# Patient Record
Sex: Male | Born: 1995 | Race: White | Hispanic: Yes | Marital: Single | Smoking: Never smoker
Health system: Southern US, Community
[De-identification: ages and names within clinical notes are randomized; demographics above are authoritative.]

## PROBLEM LIST (undated history)

## (undated) DIAGNOSIS — N200 Calculus of kidney: Secondary | ICD-10-CM

## (undated) DIAGNOSIS — K289 Gastrojejunal ulcer, unspecified as acute or chronic, without hemorrhage or perforation: Secondary | ICD-10-CM

## (undated) DIAGNOSIS — R569 Unspecified convulsions: Secondary | ICD-10-CM

## (undated) HISTORY — PX: TONSILLECTOMY: SUR1361

---

## 2017-10-24 ENCOUNTER — Emergency Department
Admit: 2017-10-25 | Payer: PRIVATE HEALTH INSURANCE | Primary: Student in an Organized Health Care Education/Training Program

## 2017-10-24 DIAGNOSIS — K5900 Constipation, unspecified: Secondary | ICD-10-CM

## 2017-10-24 NOTE — ED Notes (Signed)
MD released and gave discharge papers

## 2017-10-24 NOTE — ED Notes (Signed)
Education Note: Patient education given on carafate and GI Cocktail and the patient expresses understanding and acceptance of instructions. Lucas Dickson 10/24/2017 11:28 PM

## 2017-10-24 NOTE — ED Notes (Signed)
Pt to bathroom, vomited 150cc green, frothy. Pt states he feels better after emesis, improved from 8/10 pain, to 2/10, pain is is middle of abdomen and extends to lower pelvic. Had increasingly solid stool.

## 2017-10-24 NOTE — ED Triage Notes (Signed)
Triage note:  Complains of mid abdominal pain for the last hour with diarrhea; roommate and girlfriend

## 2017-10-24 NOTE — ED Provider Notes (Signed)
The history is provided by the patient.   Abdominal Pain    This is a new problem. The current episode started 1 to 2 hours ago. The problem occurs constantly. The problem has been rapidly improving (after vomiting in ED). The pain is associated with vomiting. The pain is located in the suprapubic region and epigastric region (Started central lower abd. Some more epigastric). Quality: was cramping and harp, now more "aching" The pain is moderate. Associated symptoms include diarrhea (Feels very constiapted but having small liquid stools), nausea, vomiting and constipation. Pertinent negatives include no anorexia, no fever, no belching, no hematochezia, no melena, no dysuria, no frequency, no hematuria, no headaches, no myalgias, no trauma, no chest pain, no testicular pain and no back pain. Nothing worsens the pain. The pain is relieved by vomiting. Past workup includes esophagogastroduodenoscopy, colonoscopy. His past medical history is significant for PUD. Past medical history comments: also lactose intolerance, had PB&J and chocolate milk tonight.      IMM UTD    Past Medical History:   Diagnosis Date   ??? Seizure (HCC)    ??? Stomach ulcer        Past Surgical History:   Procedure Laterality Date   ??? HX COLONOSCOPY     ??? HX ENDOSCOPY     ??? HX TONSIL AND ADENOIDECTOMY           History reviewed. No pertinent family history.    Social History     Socioeconomic History   ??? Marital status: SINGLE     Spouse name: Not on file   ??? Number of children: Not on file   ??? Years of education: Not on file   ??? Highest education level: Not on file   Social Needs   ??? Financial resource strain: Not on file   ??? Food insecurity - worry: Not on file   ??? Food insecurity - inability: Not on file   ??? Transportation needs - medical: Not on file   ??? Transportation needs - non-medical: Not on file   Occupational History   ??? Not on file   Tobacco Use   ??? Smoking status: Never Smoker   ??? Smokeless tobacco: Never Used    Substance and Sexual Activity   ??? Alcohol use: Not on file   ??? Drug use: Not on file   ??? Sexual activity: Not on file   Other Topics Concern   ??? Not on file   Social History Narrative   ??? Not on file         ALLERGIES: Bactrim [sulfamethoprim] and Topamax [topiramate]    Review of Systems   Constitutional: Negative for fever.   HENT: Negative for sore throat.    Eyes: Negative for photophobia.   Respiratory: Negative for cough, choking and shortness of breath.    Cardiovascular: Negative for chest pain.   Gastrointestinal: Positive for abdominal pain, constipation, diarrhea (Feels very constiapted but having small liquid stools), nausea and vomiting. Negative for anorexia, hematochezia and melena.   Endocrine: Negative for polydipsia and polyuria.   Genitourinary: Negative for discharge, dysuria, frequency, hematuria, penile swelling and testicular pain.   Musculoskeletal: Negative for back pain, myalgias and neck pain.   Skin: Negative for rash and wound.   Allergic/Immunologic: Positive for food allergies (greasy foods, lacose). Negative for immunocompromised state.   Neurological: Negative for headaches.   Psychiatric/Behavioral: The patient is nervous/anxious.        Vitals:    10/24/17 2201 10/24/17 2204  BP: 111/75    Pulse: 86    Resp: 18    Temp: 97.5 ??F (36.4 ??C)    SpO2: 100%    Weight:  124.9 kg (275 lb 5.7 oz)            Physical Exam   Physical Exam   Constitutional: Appears well. No distress.   HENT:   Head: NCAT  Ears: Right Ear: Tympanic membrane normal. Left Ear: Tympanic membrane normal.   Nose: Nose normal. No nasal discharge.   Mouth/Throat: Mucous membranes are moist. Pharynx is normal.   Eyes: Conjunctivae are normal. Right eye exhibits no discharge. Left eye exhibits no discharge.   Neck: Normal range of motion. Neck supple.   Cardiovascular: Normal rate, regular rhythm, S1 normal and S2 normal.  No murmur   2+ distal pulses    Pulmonary/Chest: Effort normal and breath sounds normal. No nasal flaring or stridor. No respiratory distress. no wheezes. no rhonchi. no rales. no retraction.   Abdominal: Soft. obese. LUQ and epigastirc tenderness. No rebound or guarding. No hernia. No masses or HSM. Stretch marks  Musculoskeletal: Normal range of motion. no edema, no tenderness, no deformity and no signs of injury.   Lymphadenopathy:   no cervical adenopathy.   Neurological:  alert. normal strength. normal muscle tone. No focal defecits  Skin: Skin is warm and dry. Capillary refill takes less than 3 seconds. Turgor is normal. No petechiae, no purpura and no rash noted. No cyanosis.    MDM      Patient is well hydrated, well appearing, and in no respiratory distress. Physical exam is reassuring, and without signs of serious illness. Given the patient's history, clinical course, physical exam, and imaging findings, abdominal pain is unlikely secondary to a surgical etiology. Possible Fatty liver. Is getting blood drawn soon by PCP for sz med screening. Will call to get LFTs added. Pain improved after Gi cocktail. XR with large stool, miralax as well. Should continue his lansoprazole. Patient will be discharged home with pain control and follow-up with GI.  Patient and caregivers were instructed on signs and symptoms of reasons to return including fever, worsening pain, vomiting, blood in the stool or any other concerns.        ICD-10-CM ICD-9-CM   1. Constipation, unspecified constipation type K59.00 564.00   2. Hepatomegaly R16.0 789.1       Current Discharge Medication List      START taking these medications    Details   sucralfate (CARAFATE) 1 gram tablet Take 1 Tab by mouth four (4) times daily as needed.  Qty: 20 Tab, Refills: 0      lansoprazole (PREVACID) 30 mg capsule Take 1 Cap by mouth Daily (before breakfast).  Qty: 30 Cap, Refills: 1      polyethylene glycol (MIRALAX) 17 gram/dose powder Take 17 g by mouth  daily. 1 tablespoon with 8 oz of water daily  Qty: 595 g, Refills: 0             Follow-up Information     Follow up With Specialties Details Why Contact Info    [facility] Schedule an appointment as soon as possible for a visit  [address]  [place] [address]  [phone]          I have reviewed discharge instructions with the parent.  The parent verbalized understanding.    11:55 PM  Darlina Rumpf M.D.    Procedures

## 2017-10-24 NOTE — ED Notes (Signed)
Pt took GI Cocktail, pt states he feels a little funny when swallowing, but has now resolved. Pt vital signs stable, O2 sats remained 99-100%, pulse 70. Pt now to xray with RN. MD aware of episode and per pt that the "funny feeling" has resolved

## 2017-10-24 NOTE — ED Notes (Signed)
MD released and gave discharge papers

## 2017-10-25 ENCOUNTER — Inpatient Hospital Stay
Admit: 2017-10-25 | Discharge: 2017-10-25 | Disposition: A | Discharge status: Home / Self Care | Payer: PRIVATE HEALTH INSURANCE | Attending: Pediatrics

## 2017-10-25 DIAGNOSIS — N200 Calculus of kidney: Secondary | ICD-10-CM

## 2017-10-25 MED ORDER — LIDOCAINE 2 % MUCOSAL SOLN
2 % | Freq: Once | Status: AC
Start: 2017-10-25 — End: 2017-10-24
  Administered 2017-10-25: 04:00:00 via ORAL

## 2017-10-25 MED ORDER — SUCRALFATE 1 GRAM TAB
1 gram | ORAL | Status: AC
Start: 2017-10-25 — End: 2017-10-24
  Administered 2017-10-25: 04:00:00 via ORAL

## 2017-10-25 MED ORDER — LANSOPRAZOLE 30 MG CAP, DELAYED RELEASE
30 mg | ORAL_CAPSULE | Freq: Every day | ORAL | 1 refills | Status: AC
Start: 2017-10-25 — End: ?

## 2017-10-25 MED ORDER — POLYETHYLENE GLYCOL 3350 100 % ORAL POWDER
17 gram/dose | Freq: Every day | ORAL | 0 refills | Status: AC
Start: 2017-10-25 — End: ?

## 2017-10-25 MED ORDER — SUCRALFATE 1 GRAM TAB
1 gram | ORAL_TABLET | Freq: Four times a day (QID) | ORAL | 0 refills | Status: DC | PRN
Start: 2017-10-25 — End: 2018-06-11

## 2017-10-25 MED FILL — MAG-AL PLUS 200 MG-200 MG-20 MG/5 ML ORAL SUSPENSION: 200-200-20 mg/5 mL | ORAL | Qty: 30

## 2017-10-25 MED FILL — SUCRALFATE 1 GRAM TAB: 1 gram | ORAL | Qty: 1

## 2017-10-25 NOTE — ED Triage Notes (Signed)
Triage: patient seen yesterday for constipation, today continues with abdominal pain and now back pain. Patient concerned d/t continued/worsening pain, and dx related to hepatomegaly also. Vomited x 1 just PTA. Decreased appetite today. No known fevers.

## 2017-10-25 NOTE — ED Provider Notes (Signed)
HPI 22 yo returned to the ER for vomiting and abd pain.   Had some dried cheerios and a salad, 1/2 banana. Was doing better and then started vomiting again tonight, 3 times. Once at home and twice in waiting room. Started yesterday with abd pain ,vomiting. No fever. Diarrhea yesterday. Diarrhea today. Started taking miralax tonight at 7pm- took 1 capful around 8 pm. girlfriend had vomiting yesterday- she had the stomach bug.    Past Medical History:   Diagnosis Date   ??? Seizure (HCC)    ??? Stomach ulcer        Past Surgical History:   Procedure Laterality Date   ??? HX COLONOSCOPY     ??? HX ENDOSCOPY     ??? HX TONSIL AND ADENOIDECTOMY           History reviewed. No pertinent family history.    Social History     Socioeconomic History   ??? Marital status: SINGLE     Spouse name: Not on file   ??? Number of children: Not on file   ??? Years of education: Not on file   ??? Highest education level: Not on file   Social Needs   ??? Financial resource strain: Not on file   ??? Food insecurity - worry: Not on file   ??? Food insecurity - inability: Not on file   ??? Transportation needs - medical: Not on file   ??? Transportation needs - non-medical: Not on file   Occupational History   ??? Not on file   Tobacco Use   ??? Smoking status: Never Smoker   ??? Smokeless tobacco: Never Used   Substance and Sexual Activity   ??? Alcohol use: Not on file   ??? Drug use: Not on file   ??? Sexual activity: Not on file   Other Topics Concern   ??? Not on file   Social History Narrative   ??? Not on file         ALLERGIES: Bactrim [sulfamethoprim] and Topamax [topiramate]    Review of Systems    Vitals:    10/25/17 2219   BP: 124/79   Pulse: 80   Resp: 18   Temp: 97.7 ??F (36.5 ??C)   SpO2: 98%   Weight: 125.5 kg (276 lb 10.8 oz)            Physical Exam   Constitutional: He is oriented to person, place, and time. He appears well-developed and well-nourished. He appears ill.   Uncomfortable, eyes closed and moaning. Points to right side and right  flank as source of most pain.    HENT:   Head: Normocephalic and atraumatic.   Mouth/Throat: Oropharynx is clear and moist. No oropharyngeal exudate.   Eyes: Conjunctivae are normal. No scleral icterus.   Neck: Normal range of motion. Neck supple.   Cardiovascular: Normal rate, regular rhythm and normal heart sounds.   Pulmonary/Chest: Effort normal and breath sounds normal. No respiratory distress.   Abdominal: Soft. Bowel sounds are normal. He exhibits no distension. There is tenderness in the right lower quadrant. There is no rebound and no guarding.   Musculoskeletal: Normal range of motion.   Lymphadenopathy:     He has no cervical adenopathy.   Neurological: He is alert and oriented to person, place, and time. No cranial nerve deficit.   Psychiatric: He has a normal mood and affect. His behavior is normal.   Nursing note and vitals reviewed.       MDM  Number of Diagnoses or Management Options  Nephrolithiasis: established and worsening  Diagnosis management comments: Pt with abd pain, vomiting in setting of known constipation from yesterday- increased abd pain, tenderness. Labs done and reassuring. CT ordered looking for itnraabd process and showed + kidney stone. Cr 1.4- ? Pre renal component with large muscular load.  Pt improved s/p zofran and toradol and fluids. Given urology f/u and meds for home. Felt much better- stable for outpatient eval.        Amount and/or Complexity of Data Reviewed  Clinical lab tests: ordered and reviewed  Tests in the radiology section of CPT??: ordered and reviewed  Obtain history from someone other than the patient: yes  Independent visualization of images, tracings, or specimens: yes    Risk of Complications, Morbidity, and/or Mortality  Presenting problems: moderate  Management options: moderate    Patient Progress  Patient progress: improved         Procedures

## 2017-10-26 ENCOUNTER — Emergency Department
Admit: 2017-10-26 | Payer: PRIVATE HEALTH INSURANCE | Primary: Student in an Organized Health Care Education/Training Program

## 2017-10-26 ENCOUNTER — Inpatient Hospital Stay
Admit: 2017-10-26 | Discharge: 2017-10-26 | Disposition: A | Discharge status: Home / Self Care | Payer: PRIVATE HEALTH INSURANCE | Attending: Pediatric Emergency Medicine

## 2017-10-26 ENCOUNTER — Emergency Department

## 2017-10-26 LAB — METABOLIC PANEL, COMPREHENSIVE
A-G Ratio: 1.2 (ref 1.1–2.2)
A-G Ratio: 1.1 (ref 1.1–2.2)
ALT (SGPT): 52 U/L (ref 12–78)
ALT (SGPT): 52 U/L (ref 12–78)
AST (SGOT): 37 U/L (ref 15–37)
AST (SGOT): 28 U/L (ref 15–37)
Albumin: 4.1 g/dL (ref 3.5–5.0)
Albumin: 4 g/dL (ref 3.5–5.0)
Alk. phosphatase: 109 U/L (ref 45–117)
Alk. phosphatase: 107 U/L (ref 45–117)
Anion gap: 8 mmol/L (ref 5–15)
Anion gap: 10 mmol/L (ref 5–15)
BUN/Creatinine ratio: 10 — ABNORMAL LOW (ref 12–20)
BUN/Creatinine ratio: 9 — ABNORMAL LOW (ref 12–20)
BUN: 14 MG/DL (ref 6–20)
BUN: 13 MG/DL (ref 6–20)
Bilirubin, total: 0.5 MG/DL (ref 0.2–1.0)
Bilirubin, total: 0.5 MG/DL (ref 0.2–1.0)
CO2: 20 mmol/L — ABNORMAL LOW (ref 21–32)
CO2: 19 mmol/L — ABNORMAL LOW (ref 21–32)
Calcium: 9.2 MG/DL (ref 8.5–10.1)
Calcium: 9 MG/DL (ref 8.5–10.1)
Chloride: 110 mmol/L — ABNORMAL HIGH (ref 97–108)
Chloride: 111 mmol/L — ABNORMAL HIGH (ref 97–108)
Creatinine: 1.46 MG/DL — ABNORMAL HIGH (ref 0.70–1.30)
Creatinine: 1.38 MG/DL — ABNORMAL HIGH (ref 0.70–1.30)
GFR est AA: >60 mL/min/{1.73_m2} (ref >60)
GFR est AA: >60 mL/min/{1.73_m2} (ref >60)
GFR est non-AA: >60 mL/min/{1.73_m2} (ref >60)
GFR est non-AA: >60 mL/min/{1.73_m2} (ref >60)
Globulin: 3.5 g/dL (ref 2.0–4.0)
Globulin: 3.5 g/dL (ref 2.0–4.0)
Glucose: 106 mg/dL — ABNORMAL HIGH (ref 65–100)
Glucose: 111 mg/dL — ABNORMAL HIGH (ref 65–100)
Potassium: 4.1 mmol/L (ref 3.5–5.1)
Potassium: 3.7 mmol/L (ref 3.5–5.1)
Protein, total: 7.6 g/dL (ref 6.4–8.2)
Protein, total: 7.5 g/dL (ref 6.4–8.2)
Sodium: 138 mmol/L (ref 136–145)
Sodium: 140 mmol/L (ref 136–145)

## 2017-10-26 LAB — URINALYSIS W/MICROSCOPIC
Bilirubin: NEGATIVE
Glucose: NEGATIVE mg/dL
Leukocyte Esterase: NEGATIVE
Nitrites: NEGATIVE
Protein: NEGATIVE mg/dL
Specific gravity: >1.03 — ABNORMAL HIGH (ref 1.003–1.030)
Urobilinogen: 1 EU/dL (ref 0.2–1.0)
pH (UA): 6 (ref 5.0–8.0)

## 2017-10-26 LAB — CBC WITH AUTOMATED DIFF
ABS. BASOPHILS: 0.1 10*3/uL (ref 0.0–0.1)
ABS. EOSINOPHILS: 0 10*3/uL (ref 0.0–0.4)
ABS. IMM. GRANS.: 0.1 10*3/uL — ABNORMAL HIGH (ref 0.00–0.04)
ABS. LYMPHOCYTES: 1.2 10*3/uL (ref 0.8–3.5)
ABS. MONOCYTES: 0.5 10*3/uL (ref 0.0–1.0)
ABS. NEUTROPHILS: 12.8 10*3/uL — ABNORMAL HIGH (ref 1.8–8.0)
ABSOLUTE NRBC: 0 10*3/uL (ref 0.00–0.01)
BASOPHILS: 0 % (ref 0–1)
EOSINOPHILS: 0 % (ref 0–7)
HCT: 44.6 % (ref 36.6–50.3)
HGB: 14.7 g/dL (ref 12.1–17.0)
IMMATURE GRANULOCYTES: 0 % (ref 0.0–0.5)
LYMPHOCYTES: 8 % — ABNORMAL LOW (ref 12–49)
MCH: 29.3 PG (ref 26.0–34.0)
MCHC: 33 g/dL (ref 30.0–36.5)
MCV: 88.8 FL (ref 80.0–99.0)
MONOCYTES: 4 % — ABNORMAL LOW (ref 5–13)
MPV: 9.7 FL (ref 8.9–12.9)
NEUTROPHILS: 87 % — ABNORMAL HIGH (ref 32–75)
NRBC: 0 PER 100 WBC
PLATELET: 240 10*3/uL (ref 150–400)
RBC: 5.02 M/uL (ref 4.10–5.70)
RDW: 13.2 % (ref 11.5–14.5)
WBC: 14.7 10*3/uL — ABNORMAL HIGH (ref 4.1–11.1)

## 2017-10-26 LAB — CREATININE
Creatinine: 1.45 MG/DL — ABNORMAL HIGH (ref 0.70–1.30)
GFR est AA: >60 mL/min/{1.73_m2} (ref >60)
GFR est non-AA: >60 mL/min/{1.73_m2} (ref >60)

## 2017-10-26 LAB — LIPASE: Lipase: 96 U/L (ref 73–393)

## 2017-10-26 LAB — SAMPLES BEING HELD

## 2017-10-26 LAB — C REACTIVE PROTEIN, QT: C-Reactive protein: 0.63 mg/dL — ABNORMAL HIGH (ref 0.00–0.60)

## 2017-10-26 MED ORDER — IOPAMIDOL 76 % IV SOLN
370 mg iodine /mL (76 %) | Freq: Once | INTRAVENOUS | Status: AC
Start: 2017-10-26 — End: 2017-10-26
  Administered 2017-10-26: 08:00:00 via INTRAVENOUS

## 2017-10-26 MED ORDER — SODIUM CHLORIDE 0.9 % IJ SYRG
Freq: Once | INTRAMUSCULAR | Status: AC
Start: 2017-10-26 — End: 2017-10-26
  Administered 2017-10-26: 08:00:00 via INTRAVENOUS

## 2017-10-26 MED ORDER — TAMSULOSIN SR 0.4 MG 24 HR CAP
0.4 mg | ORAL | Status: AC
Start: 2017-10-26 — End: 2017-10-26
  Administered 2017-10-26: 10:00:00 via ORAL

## 2017-10-26 MED ORDER — SODIUM CHLORIDE 0.9% BOLUS IV
0.9 % | Freq: Once | INTRAVENOUS | Status: AC
Start: 2017-10-26 — End: 2017-10-26
  Administered 2017-10-26: 05:00:00 via INTRAVENOUS

## 2017-10-26 MED ORDER — ONDANSETRON 4 MG TAB, RAPID DISSOLVE
4 mg | ORAL | Status: AC
Start: 2017-10-26 — End: 2017-10-25
  Administered 2017-10-26: 03:00:00 via ORAL

## 2017-10-26 MED ORDER — ACETAMINOPHEN 325 MG TABLET
325 mg | ORAL | Status: AC
Start: 2017-10-26 — End: 2017-10-26
  Administered 2017-10-26: 08:00:00 via ORAL

## 2017-10-26 MED ORDER — KETOROLAC TROMETHAMINE 30 MG/ML INJECTION
30 mg/mL (1 mL) | INTRAMUSCULAR | Status: AC
Start: 2017-10-26 — End: 2017-10-26
  Administered 2017-10-26: 08:00:00 via INTRAVENOUS

## 2017-10-26 MED ORDER — ONDANSETRON (PF) 4 MG/2 ML INJECTION
4 mg/2 mL | INTRAMUSCULAR | Status: AC
Start: 2017-10-26 — End: 2017-10-26
  Administered 2017-10-26: 05:00:00 via INTRAVENOUS

## 2017-10-26 MED ORDER — SODIUM CHLORIDE 0.9% BOLUS IV
0.9 % | Freq: Once | INTRAVENOUS | Status: AC
Start: 2017-10-26 — End: 2017-10-26
  Administered 2017-10-26: 08:00:00 via INTRAVENOUS

## 2017-10-26 MED ORDER — KETOROLAC TROMETHAMINE 30 MG/ML INJECTION
30 mg/mL (1 mL) | INTRAMUSCULAR | Status: DC
Start: 2017-10-26 — End: 2017-10-26

## 2017-10-26 MED ORDER — ONDANSETRON 4 MG TAB, RAPID DISSOLVE
4 mg | ORAL_TABLET | Freq: Three times a day (TID) | ORAL | 0 refills | Status: AC | PRN
Start: 2017-10-26 — End: 2018-10-21

## 2017-10-26 MED ORDER — TAMSULOSIN SR 0.4 MG 24 HR CAP
0.4 mg | ORAL_CAPSULE | Freq: Every day | ORAL | 0 refills | Status: AC
Start: 2017-10-26 — End: 2017-11-10

## 2017-10-26 MED FILL — KETOROLAC TROMETHAMINE 30 MG/ML INJECTION: 30 mg/mL (1 mL) | INTRAMUSCULAR | Qty: 1

## 2017-10-26 MED FILL — ACETAMINOPHEN 325 MG TABLET: 325 mg | ORAL | Qty: 3

## 2017-10-26 MED FILL — SODIUM CHLORIDE 0.9 % IV: INTRAVENOUS | Qty: 1000

## 2017-10-26 MED FILL — ONDANSETRON (PF) 4 MG/2 ML INJECTION: 4 mg/2 mL | INTRAMUSCULAR | Qty: 2

## 2017-10-26 MED FILL — NORMAL SALINE FLUSH 0.9 % INJECTION SYRINGE: INTRAMUSCULAR | Qty: 10

## 2017-10-26 MED FILL — TAMSULOSIN SR 0.4 MG 24 HR CAP: 0.4 mg | ORAL | Qty: 1

## 2017-10-26 MED FILL — ISOVUE-370  76 % INTRAVENOUS SOLUTION: 370 mg iodine /mL (76 %) | INTRAVENOUS | Qty: 100

## 2017-10-26 MED FILL — SODIUM CHLORIDE 0.9 % IV: INTRAVENOUS | Qty: 100

## 2017-10-26 NOTE — ED Notes (Signed)
Given a strainer to void through to possibly catch the stone. 2nd bolus completed.  Pain now 4/10

## 2017-10-26 NOTE — ED Notes (Signed)
Pt tolerated iv placement well and blood drawn. Pt having waves of increased right abdominal pain and back pain. Pt pain level around 8/10. Heat packs placed on abdomen and back.

## 2017-10-26 NOTE — ED Notes (Signed)
Pt is sleeping, no apparent distress.

## 2017-10-26 NOTE — ED Notes (Signed)
To CT via stretcher.  Had been sleeping but awoke and began writhing again in pain.  The warm packs to his back on the right and to his abdomin have helped some.  When he returns I will give him some tylenol

## 2017-10-26 NOTE — ED Notes (Signed)
released with instructions to home.

## 2017-10-26 NOTE — ED Notes (Signed)
Sipping on gator ade, less nauseated. MD spoke to patient about the plan

## 2017-10-26 NOTE — ED Notes (Signed)
Now asleep.

## 2017-10-26 NOTE — ED Notes (Signed)
Pt ambulated to bathroom, supervised by RN, but stable and able to ambulate independently.Pt continues to gag, emesis of about 75cc, green.

## 2017-10-26 NOTE — ED Notes (Signed)
Patient ambulatory to bathroom at this time.

## 2018-04-04 ENCOUNTER — Other Ambulatory Visit: Payer: Self-pay

## 2018-04-04 ENCOUNTER — Ambulatory Visit (INDEPENDENT_AMBULATORY_CARE_PROVIDER_SITE_OTHER): Payer: Managed Care, Other (non HMO)

## 2018-04-04 ENCOUNTER — Encounter (HOSPITAL_COMMUNITY): Payer: Self-pay | Admitting: Emergency Medicine

## 2018-04-04 ENCOUNTER — Ambulatory Visit (HOSPITAL_COMMUNITY)
Admission: EM | Admit: 2018-04-04 | Discharge: 2018-04-04 | Disposition: A | Discharge status: Home / Self Care | Payer: Managed Care, Other (non HMO) | Attending: Family Medicine | Admitting: Family Medicine

## 2018-04-04 DIAGNOSIS — K219 Gastro-esophageal reflux disease without esophagitis: Secondary | ICD-10-CM

## 2018-04-04 HISTORY — DX: Calculus of kidney: N20.0

## 2018-04-04 HISTORY — DX: Unspecified convulsions: R56.9

## 2018-04-04 HISTORY — DX: Gastrojejunal ulcer, unspecified as acute or chronic, without hemorrhage or perforation: K28.9

## 2018-04-04 MED ORDER — GI COCKTAIL ~~LOC~~
30.0000 mL | Freq: Once | ORAL | Status: AC
  Administered 2018-04-04: 30 mL via ORAL

## 2018-04-04 MED ORDER — OMEPRAZOLE 20 MG PO CPDR: 20.0000 mg | DELAYED_RELEASE_CAPSULE | Freq: Every day | ORAL | 1 refills | Status: AC

## 2018-04-04 MED ORDER — GI COCKTAIL ~~LOC~~
ORAL | Status: AC
  Filled 2018-04-04: qty 30

## 2018-04-04 MED ORDER — RANITIDINE HCL 150 MG PO CAPS: 150.0000 mg | ORAL_CAPSULE | Freq: Every day | ORAL | 1 refills | Status: AC

## 2018-04-04 NOTE — ED Provider Notes (Signed)
[facility]    CSN: [id number] Arrival date & time: 04/04/18  1654     History   Chief Complaint Chief Complaint  Patient presents with  . Breathing Problem    HPI Hector Ewing is a 22 y.o. male.   Patient is a 22 year old male with past medical history of GI ulcer, kidney stones, seizures.  He presents with a month and a half of chest discomfort, shortness of breath.  These problems are intermittent but seem to be worse at night.  He reports that sometimes he is awoken in the middle of the night when he is lying flat with shortness of breath and a choking sensation.  The chest pain is central and epigastric.  Sometimes he has issues swallowing.  Patient takes omeprazole most days for history of GI ulcers.  He denies any undue stress or history of anxiety attacks or anxiety.  He is currently in college and his diet consists of a lot of takeout with spicy and greasy foods.  He denies any coughing, congestion, fever, chills, body aches.  He denies any recent traveling or history of DVT or PE.  He does not smoke.  He is currently not having any chest pain  ROS per HPI      Past Medical History:  Diagnosis Date  . Gastrointestinal ulcer   . Kidney stones   . Seizures (HCC)     There are no active problems to display for this patient.   Past Surgical History:  Procedure Laterality Date  . TONSILLECTOMY         Home Medications    Prior to Admission medications   Medication Sig Start Date End Date Taking? Authorizing Provider  omeprazole (PRILOSEC) 10 MG capsule Take 10 mg by mouth daily.   Yes [provider]  zonisamide (ZONEGRAN) 100 MG capsule Take 500 mg by mouth daily.   Yes [provider]    Family History Family History  Problem Relation Age of Onset  . Thyroid disease Mother     Social History Social History   Tobacco Use  . Smoking status: Never Smoker  Substance Use Topics  . Alcohol use: Never    Frequency: Never    . Drug use: Never     Allergies   Bactrim [sulfamethoxazole-trimethoprim] and Topamax [topiramate]   Review of Systems Review of Systems   Physical Exam Triage Vital Signs ED Triage Vitals  Enc Vitals Group     BP 04/04/18 1714 115/68     Pulse Rate 04/04/18 1714 67     Resp 04/04/18 1714 18     Temp 04/04/18 1714 98.2 F (36.8 C)     Temp Source 04/04/18 1714 Oral     SpO2 04/04/18 1714 100 %     Weight --      Height --      Head Circumference --      Peak Flow --      Pain Score 04/04/18 1723 4     Pain Loc --      Pain Edu? --      Excl. in GC? --    No data found.  Updated Vital Signs BP 115/68 (BP Location: Left Arm)   Pulse 67   Temp 98.2 F (36.8 C) (Oral)   Resp 18   SpO2 100%   Visual Acuity Right Eye Distance:   Left Eye Distance:   Bilateral Distance:    Right Eye Near:   Left Eye Near:  Bilateral Near:     Physical Exam  Constitutional: He is oriented to person, place, and time. He appears well-developed and well-nourished.  HENT:  Head: Normocephalic and atraumatic.  Neck: Normal range of motion.  Cardiovascular: Normal rate and regular rhythm.  Pulmonary/Chest: Effort normal and breath sounds normal.  No adventitious breath sounds.  Abdominal: Soft. He exhibits no distension and no mass. There is tenderness. There is no rebound and no guarding. No hernia.  Tenderness to palpation of epigastric area  Musculoskeletal: Normal range of motion.  Nontender to chest wall  Neurological: He is alert and oriented to person, place, and time.  Skin: Skin is warm and dry.  Psychiatric: He has a normal mood and affect.  Nursing note and vitals reviewed.    UC Treatments / Results  Labs (all labs ordered are listed, but only abnormal results are displayed) Labs Reviewed - No data to display  EKG None  Radiology Dg Chest 2 View  Result Date: 04/04/2018 CLINICAL DATA:  Shortness of breath, chest pain for a month. EXAM: CHEST - 2 VIEW  COMPARISON:  None. FINDINGS: The heart size and mediastinal contours are within normal limits. Both lungs are clear. The visualized skeletal structures are unremarkable. IMPRESSION: No active cardiopulmonary disease. Electronically Signed   By: Bary RichardStan  Maynard M.D.   On: 04/04/2018 18:19    Procedures Procedures (including critical care time)  Medications Ordered in UC Medications  gi cocktail (Maalox,Lidocaine,Donnatal) (30 mLs Oral Given 04/04/18 1820)    Initial Impression / Assessment and Plan / UC Course  I have reviewed the triage vital signs and the nursing notes.  Pertinent labs & imaging results that were available during my care of the patient were reviewed by me and considered in my medical decision making (see chart for details).     Most likely patient's symptoms are related to GERD.  We will do EKG and chest x-ray to rule out any cardiac or pulmonary issues.  If negative will treat for GERD with omeprazole daily and Zantac at bedtime. Will give a handout on the appropriate GERD diet. If the symptoms do not improve he may need to follow up with GI.   Chest x-ray normal.  EKG showed sinus bradycardia with marked sinus arrhythmia but otherwise normal.  No concerns for any cardiac pulmonary etiologies.  Will treat for GERD as planned.  Final Clinical Impressions(s) / UC Diagnoses   Final diagnoses:  Gastroesophageal reflux disease, esophagitis presence not specified     Discharge Instructions     It was nice meeting you!!  Your EKG and chest x ray were normal.  I believe your symptoms are associated with acid reflux.  We are going to try omeprazole 20 mg once daily and zantac 150 mg at night.  Make sure that you take the omeprazole 30- 60 minutes prior to a meal with a glass of water.  Avoid spicy, greasy foods, caffeine, chocolate and milk products.  No eating 2-3 hours before bedtime. Elevate the head of the bed 30 degrees.  Try this for a few weeks to see if this  improves your symptoms.  If you don't see any improvement or your symptoms worsen please follow up with a GI doctor.     ED Prescriptions    None     Controlled Substance Prescriptions [place] Controlled Substance Registry consulted? Not Applicable   Janace ArisBast, Fortunato Nordin A, NP 04/04/18 1842

## 2018-04-04 NOTE — ED Notes (Signed)
Patient transported to X-ray 

## 2018-04-04 NOTE — ED Triage Notes (Signed)
Patient reports having trouble breathing, chest soreness for a month.  Difficulty swallowing for 1/2 month.  Patient has been seen for respiratory complaints.

## 2018-04-04 NOTE — Discharge Instructions (Addendum)
It was nice meeting you!!  Your EKG and chest x ray were normal.  I believe your symptoms are associated with acid reflux.  We are going to try omeprazole 20 mg once daily and zantac 150 mg at night.  Make sure that you take the omeprazole 30- 60 minutes prior to a meal with a glass of water.  Avoid spicy, greasy foods, caffeine, chocolate and milk products.  No eating 2-3 hours before bedtime. Elevate the head of the bed 30 degrees.  Try this for a few weeks to see if this improves your symptoms.  If you don't see any improvement or your symptoms worsen please follow up with a GI doctor.

## 2018-06-11 ENCOUNTER — Inpatient Hospital Stay
Admit: 2018-06-11 | Discharge: 2018-06-12 | Disposition: A | Discharge status: Home / Self Care | Payer: PRIVATE HEALTH INSURANCE | Attending: Pediatric Emergency Medicine

## 2018-06-11 DIAGNOSIS — N200 Calculus of kidney: Secondary | ICD-10-CM

## 2018-06-11 LAB — COMPREHENSIVE METABOLIC PANEL
ALT: 43 U/L (ref 12–78)
AST: 33 U/L (ref 15–37)
Albumin/Globulin Ratio: 1.2 (ref 1.1–2.2)
Albumin: 4.2 g/dL (ref 3.5–5.0)
Alkaline Phosphatase: 106 U/L (ref 45–117)
Anion Gap: 9 mmol/L (ref 5–15)
BUN: 16 MG/DL (ref 6–20)
Bun/Cre Ratio: 12 (ref 12–20)
CO2: 23 mmol/L (ref 21–32)
Calcium: 9.4 MG/DL (ref 8.5–10.1)
Chloride: 109 mmol/L — ABNORMAL HIGH (ref 97–108)
Creatinine: 1.35 MG/DL — ABNORMAL HIGH (ref 0.70–1.30)
EGFR IF NonAfrican American: >60 mL/min/{1.73_m2} (ref >60)
GFR African American: >60 mL/min/{1.73_m2} (ref >60)
Globulin: 3.6 g/dL (ref 2.0–4.0)
Glucose: 122 mg/dL — ABNORMAL HIGH (ref 65–100)
Potassium: 3.6 mmol/L (ref 3.5–5.1)
Sodium: 141 mmol/L (ref 136–145)
Total Bilirubin: 0.4 MG/DL (ref 0.2–1.0)
Total Protein: 7.8 g/dL (ref 6.4–8.2)

## 2018-06-11 LAB — CBC WITH AUTO DIFFERENTIAL
Basophils %: 1 % (ref 0–1)
Basophils Absolute: 0.1 10*3/uL (ref 0.0–0.1)
Eosinophils %: 1 % (ref 0–7)
Eosinophils Absolute: 0.1 10*3/uL (ref 0.0–0.4)
Granulocyte Absolute Count: 0.1 10*3/uL — ABNORMAL HIGH (ref 0.00–0.04)
Hematocrit: 46.4 % (ref 36.6–50.3)
Hemoglobin: 15.5 g/dL (ref 12.1–17.0)
Immature Granulocytes: 0 % (ref 0.0–0.5)
Lymphocytes %: 20 % (ref 12–49)
Lymphocytes Absolute: 2.5 10*3/uL (ref 0.8–3.5)
MCH: 30 PG (ref 26.0–34.0)
MCHC: 33.4 g/dL (ref 30.0–36.5)
MCV: 89.9 FL (ref 80.0–99.0)
MPV: 9.6 FL (ref 8.9–12.9)
Monocytes %: 5 % (ref 5–13)
Monocytes Absolute: 0.6 10*3/uL (ref 0.0–1.0)
NRBC Absolute: 0 10*3/uL (ref 0.00–0.01)
Neutrophils %: 73 % (ref 32–75)
Neutrophils Absolute: 8.8 10*3/uL — ABNORMAL HIGH (ref 1.8–8.0)
Nucleated RBCs: 0 PER 100 WBC
Platelets: 257 10*3/uL (ref 150–400)
RBC: 5.16 M/uL (ref 4.10–5.70)
RDW: 12.8 % (ref 11.5–14.5)
WBC: 12.1 10*3/uL — ABNORMAL HIGH (ref 4.1–11.1)

## 2018-06-11 LAB — URINALYSIS WITH MICROSCOPIC
BACTERIA, URINE: NEGATIVE /hpf
Bilirubin, Urine: NEGATIVE
Glucose, Ur: NEGATIVE mg/dL
Ketones, Urine: NEGATIVE mg/dL
Leukocyte Esterase, Urine: NEGATIVE
Nitrite, Urine: NEGATIVE
Protein, UA: 30 mg/dL — AB
RBC, UA: >100 /hpf — ABNORMAL HIGH (ref 0–5)
Specific Gravity, UA: 1.02 (ref 1.003–1.030)
Urobilinogen, UA, POCT: 0.2 EU/dL (ref 0.2–1.0)
pH, UA: 6 (ref 5.0–8.0)

## 2018-06-11 LAB — C-REACTIVE PROTEIN: CRP: 0.64 mg/dL — ABNORMAL HIGH (ref 0.00–0.60)

## 2018-06-11 LAB — LIPASE
Lipase: 113 U/L (ref 73–393)
Lipase: 113 U/L (ref 73–393)

## 2018-06-11 LAB — SAMPLES BEING HELD

## 2018-06-11 LAB — CBC WITH AUTOMATED DIFF
ABS. BASOPHILS: 0.1 10*3/uL (ref 0.0–0.1)
ABS. EOSINOPHILS: 0.1 10*3/uL (ref 0.0–0.4)
ABS. IMM. GRANS.: 0.1 10*3/uL — ABNORMAL HIGH (ref 0.00–0.04)
ABS. LYMPHOCYTES: 2.5 10*3/uL (ref 0.8–3.5)
ABS. MONOCYTES: 0.6 10*3/uL (ref 0.0–1.0)
ABS. NEUTROPHILS: 8.8 10*3/uL — ABNORMAL HIGH (ref 1.8–8.0)
ABSOLUTE NRBC: 0 10*3/uL (ref 0.00–0.01)
BASOPHILS: 1 % (ref 0–1)
EOSINOPHILS: 1 % (ref 0–7)
HCT: 46.4 % (ref 36.6–50.3)
HGB: 15.5 g/dL (ref 12.1–17.0)
IMMATURE GRANULOCYTES: 0 % (ref 0.0–0.5)
LYMPHOCYTES: 20 % (ref 12–49)
MCH: 30 PG (ref 26.0–34.0)
MCHC: 33.4 g/dL (ref 30.0–36.5)
MCV: 89.9 FL (ref 80.0–99.0)
MONOCYTES: 5 % (ref 5–13)
MPV: 9.6 FL (ref 8.9–12.9)
NEUTROPHILS: 73 % (ref 32–75)
NRBC: 0 PER 100 WBC
PLATELET: 257 10*3/uL (ref 150–400)
RBC: 5.16 M/uL (ref 4.10–5.70)
RDW: 12.8 % (ref 11.5–14.5)
WBC: 12.1 10*3/uL — ABNORMAL HIGH (ref 4.1–11.1)

## 2018-06-11 LAB — URINALYSIS W/MICROSCOPIC
Bacteria: NEGATIVE /hpf
Bilirubin: NEGATIVE
Glucose: NEGATIVE mg/dL
Ketone: NEGATIVE mg/dL
Leukocyte Esterase: NEGATIVE
Nitrites: NEGATIVE
Protein: 30 mg/dL — AB
RBC: >100 /hpf — ABNORMAL HIGH (ref 0–5)
Specific gravity: 1.02 (ref 1.003–1.030)
Urobilinogen: 0.2 EU/dL (ref 0.2–1.0)
pH (UA): 6 (ref 5.0–8.0)

## 2018-06-11 LAB — C REACTIVE PROTEIN, QT: C-Reactive protein: 0.64 mg/dL — ABNORMAL HIGH (ref 0.00–0.60)

## 2018-06-11 LAB — METABOLIC PANEL, COMPREHENSIVE
A-G Ratio: 1.2 (ref 1.1–2.2)
ALT (SGPT): 43 U/L (ref 12–78)
AST (SGOT): 33 U/L (ref 15–37)
Albumin: 4.2 g/dL (ref 3.5–5.0)
Alk. phosphatase: 106 U/L (ref 45–117)
Anion gap: 9 mmol/L (ref 5–15)
BUN/Creatinine ratio: 12 (ref 12–20)
BUN: 16 MG/DL (ref 6–20)
Bilirubin, total: 0.4 MG/DL (ref 0.2–1.0)
CO2: 23 mmol/L (ref 21–32)
Calcium: 9.4 MG/DL (ref 8.5–10.1)
Chloride: 109 mmol/L — ABNORMAL HIGH (ref 97–108)
Creatinine: 1.35 MG/DL — ABNORMAL HIGH (ref 0.70–1.30)
GFR est AA: >60 mL/min/{1.73_m2} (ref >60)
GFR est non-AA: >60 mL/min/{1.73_m2} (ref >60)
Globulin: 3.6 g/dL (ref 2.0–4.0)
Glucose: 122 mg/dL — ABNORMAL HIGH (ref 65–100)
Potassium: 3.6 mmol/L (ref 3.5–5.1)
Protein, total: 7.8 g/dL (ref 6.4–8.2)
Sodium: 141 mmol/L (ref 136–145)

## 2018-06-11 MED ORDER — SODIUM CHLORIDE 0.9% BOLUS IV
0.9 % | Freq: Once | INTRAVENOUS | Status: AC
Start: 2018-06-11 — End: 2018-06-11
  Administered 2018-06-11: 21:00:00 via INTRAVENOUS

## 2018-06-11 MED ORDER — KETOROLAC TROMETHAMINE 30 MG/ML INJECTION
30 mg/mL (1 mL) | INTRAMUSCULAR | Status: AC
Start: 2018-06-11 — End: 2018-06-11
  Administered 2018-06-11: 21:00:00 via INTRAVENOUS

## 2018-06-11 MED ORDER — ONDANSETRON 4 MG TAB, RAPID DISSOLVE
4 mg | ORAL | Status: AC
Start: 2018-06-11 — End: 2018-06-11
  Administered 2018-06-11: 20:00:00 via ORAL

## 2018-06-11 MED FILL — SODIUM CHLORIDE 0.9 % IV: INTRAVENOUS | Qty: 1000

## 2018-06-11 MED FILL — KETOROLAC TROMETHAMINE 30 MG/ML INJECTION: 30 mg/mL (1 mL) | INTRAMUSCULAR | Qty: 1

## 2018-06-11 NOTE — ED Provider Notes (Signed)
HPI 22 yo with hx of seizure d/o and kidney stone- here for avute on set of abdominal pain- right > left- was sitting in lunch room eating a sandwich when it started . No fever. No preceeding symptoms. Started suddenly made it back to his room, thought it might be a kidney stone as he has had one in the past   Vomited x 3 times at home, dry heaving as well. Has had small amounts of stool when he goes to the bathroom- " like when you are going to fart and a little comes out"  Did not take any medicines. Said he went to the bathroom and it looked like blood in his urine. No prior urinary symptoms.        Past Medical History:   Diagnosis Date   ??? Kidney stone    ??? Seizure (HCC)    ??? Seizures (HCC)    ??? Stomach ulcer        Past Surgical History:   Procedure Laterality Date   ??? HX COLONOSCOPY     ??? HX ENDOSCOPY     ??? HX TONSIL AND ADENOIDECTOMY           History reviewed. No pertinent family history.    Social History     Socioeconomic History   ??? Marital status: SINGLE     Spouse name: Not on file   ??? Number of children: Not on file   ??? Years of education: Not on file   ??? Highest education level: Not on file   Occupational History   ??? Not on file   Social Needs   ??? Financial resource strain: Not on file   ??? Food insecurity:     Worry: Not on file     Inability: Not on file   ??? Transportation needs:     Medical: Not on file     Non-medical: Not on file   Tobacco Use   ??? Smoking status: Never Smoker   ??? Smokeless tobacco: Never Used   Substance and Sexual Activity   ??? Alcohol use: Not on file   ??? Drug use: Not on file   ??? Sexual activity: Not on file   Lifestyle   ??? Physical activity:     Days per week: Not on file     Minutes per session: Not on file   ??? Stress: Not on file   Relationships   ??? Social connections:     Talks on phone: Not on file     Gets together: Not on file     Attends religious service: Not on file     Active member of club or organization: Not on file      Attends meetings of clubs or organizations: Not on file     Relationship status: Not on file   ??? Intimate partner violence:     Fear of current or ex partner: Not on file     Emotionally abused: Not on file     Physically abused: Not on file     Forced sexual activity: Not on file   Other Topics Concern   ??? Not on file   Social History Narrative   ??? Not on file         ALLERGIES: Bactrim [sulfamethoprim] and Topamax [topiramate]    Review of Systems   Gastrointestinal: Positive for abdominal pain, nausea and vomiting.   All other systems reviewed and are negative.      Vitals:  06/11/18 1603 06/11/18 1911   BP: 144/84 109/74   Pulse: 100 92   Resp: 20 18   Temp: 97.5 ??F (36.4 ??C) 98.5 ??F (36.9 ??C)   SpO2: 97% 97%   Weight: 122.2 kg (269 lb 6.4 oz)             Physical Exam   Constitutional: He is oriented to person, place, and time. He appears well-developed and well-nourished. He appears distressed.   + writhing in bed   HENT:   Mouth/Throat: No oropharyngeal exudate.   Cardiovascular: Normal rate and regular rhythm.   Pulmonary/Chest: Effort normal and breath sounds normal.   Abdominal: Soft. Normal appearance. There is tenderness (lower quadrant tenderness, no CVA tenderness.). There is no rebound and no guarding.   Genitourinary: Penis normal.   Genitourinary Comments: Testes wnl. Nontender, no swelling   Musculoskeletal: Normal range of motion.   Neurological: He is alert and oriented to person, place, and time.   Nursing note and vitals reviewed.       MDM  Number of Diagnoses or Management Options  Diagnosis management comments: 22 yo with acute on set of pain- blood in urine and feels that he passed the stone here. Urine is much clearere and easy to pass. Pain resolved. Labs reassuring, no sign of appenditcitis or other complicating process. UA showed no signs of infection. F/u with urology. As stone was passed will not give flomax.        Amount and/or Complexity of Data Reviewed   Clinical lab tests: ordered and reviewed  Obtain history from someone other than the patient: yes    Risk of Complications, Morbidity, and/or Mortality  Presenting problems: moderate  Management options: moderate    Patient Progress  Patient progress: improved         Procedures

## 2018-06-11 NOTE — ED Notes (Addendum)
Pt. States "I just went to the bathroom for a sustained amount of time, it was clear and I think if I had a stone, I passed it."  Notified Dr. Rogers     Pt. Is drinking apple juice at this time without difficulty

## 2018-06-11 NOTE — ED Notes (Signed)
Pt reports pain resolved.

## 2018-06-11 NOTE — ED Notes (Signed)
EDUCATION: Patient education given on motrin and the patient expresses understanding and acceptance of instructions. Eliza F Petrohovich, RN 06/11/2018 8:06 PM

## 2018-06-11 NOTE — ED Notes (Signed)
Patient attempted to void but states that he is unable to at this time.  Pt. Now ambulating to bathroom because he states that he is going to have a bowel movement

## 2018-06-11 NOTE — ED Notes (Signed)
Patient voided in urinal.  Urine is amber in color

## 2018-06-11 NOTE — ED Triage Notes (Signed)
Patient complaining of abdominal pain that started X1 hour ago.  Patient also noticed blood in his urine today.  Patient with 3 episodes of vomiting.

## 2018-06-11 NOTE — ED Notes (Signed)
Pt discharged home with parent/guardian. Pt acting age appropriately, respirations  Regular and unlabored, cap refill less than two seconds. Parent/guardian verbalized understanding of discharge paperwork and has no further questions at this time.

## 2018-06-11 NOTE — ED Notes (Signed)
Bedside and Verbal shift change report given to Eliza Petrohovich, RN (oncoming nurse) by Alexis Frank, RN (offgoing nurse). Report included the following information ED Summary.

## 2018-06-11 NOTE — ED Notes (Signed)
Pt. States "I just went to the bathroom for a sustained amount of time, it was clear and I think if I had a stone, I passed it."  Notified Dr. Aundria Rud     Pt. Is drinking apple juice at this time without difficulty

## 2018-06-11 NOTE — ED Provider Notes (Signed)
HPI 22 yo with hx of seizure d/o and kidney stone- here for avute on set of abdominal pain- right > left- was sitting in lunch room eating a sandwich when it started . No fever. No preceeding symptoms. Started suddenly made it back to his room, thought it might be a kidney stone as he has had one in the past   Vomited x 3 times at home, dry heaving as well. Has had small amounts of stool when he goes to the bathroom- " like when you are going to fart and a little comes out"  Did not take any medicines. Said he went to the bathroom and it looked like blood in his urine. No prior urinary symptoms.        Past Medical History:   Diagnosis Date   ??? Kidney stone    ??? Seizure (HCC)    ??? Seizures (HCC)    ??? Stomach ulcer        Past Surgical History:   Procedure Laterality Date   ??? HX COLONOSCOPY     ??? HX ENDOSCOPY     ??? HX TONSIL AND ADENOIDECTOMY           History reviewed. No pertinent family history.    Social History     Socioeconomic History   ??? Marital status: SINGLE     Spouse name: Not on file   ??? Number of children: Not on file   ??? Years of education: Not on file   ??? Highest education level: Not on file   Occupational History   ??? Not on file   Social Needs   ??? Financial resource strain: Not on file   ??? Food insecurity:     Worry: Not on file     Inability: Not on file   ??? Transportation needs:     Medical: Not on file     Non-medical: Not on file   Tobacco Use   ??? Smoking status: Never Smoker   ??? Smokeless tobacco: Never Used   Substance and Sexual Activity   ??? Alcohol use: Not on file   ??? Drug use: Not on file   ??? Sexual activity: Not on file   Lifestyle   ??? Physical activity:     Days per week: Not on file     Minutes per session: Not on file   ??? Stress: Not on file   Relationships   ??? Social connections:     Talks on phone: Not on file     Gets together: Not on file     Attends religious service: Not on file     Active member of club or organization: Not on file     Attends meetings of clubs or organizations:  Not on file     Relationship status: Not on file   ??? Intimate partner violence:     Fear of current or ex partner: Not on file     Emotionally abused: Not on file     Physically abused: Not on file     Forced sexual activity: Not on file   Other Topics Concern   ??? Not on file   Social History Narrative   ??? Not on file         ALLERGIES: Bactrim [sulfamethoprim] and Topamax [topiramate]    Review of Systems   Gastrointestinal: Positive for abdominal pain, nausea and vomiting.   All other systems reviewed and are negative.      Vitals:  06/11/18 1603 06/11/18 1911   BP: 144/84 109/74   Pulse: 100 92   Resp: 20 18   Temp: 97.5 ??F (36.4 ??C) 98.5 ??F (36.9 ??C)   SpO2: 97% 97%   Weight: 122.2 kg (269 lb 6.4 oz)             Physical Exam   Constitutional: He is oriented to person, place, and time. He appears well-developed and well-nourished. He appears distressed.   + writhing in bed   HENT:   Mouth/Throat: No oropharyngeal exudate.   Cardiovascular: Normal rate and regular rhythm.   Pulmonary/Chest: Effort normal and breath sounds normal.   Abdominal: Soft. Normal appearance. There is tenderness (lower quadrant tenderness, no CVA tenderness.). There is no rebound and no guarding.   Genitourinary: Penis normal.   Genitourinary Comments: Testes wnl. Nontender, no swelling   Musculoskeletal: Normal range of motion.   Neurological: He is alert and oriented to person, place, and time.   Nursing note and vitals reviewed.       MDM  Number of Diagnoses or Management Options  Diagnosis management comments: 22 yo with acute on set of pain- blood in urine and feels that he passed the stone here. Urine is much clearere and easy to pass. Pain resolved. Labs reassuring, no sign of appenditcitis or other complicating process. UA showed no signs of infection. F/u with urology. As stone was passed will not give flomax.        Amount and/or Complexity of Data Reviewed  Clinical lab tests: ordered and reviewed  Obtain history from  someone other than the patient: yes    Risk of Complications, Morbidity, and/or Mortality  Presenting problems: moderate  Management options: moderate    Patient Progress  Patient progress: improved         Procedures

## 2018-06-11 NOTE — ED Notes (Signed)
EDUCATION: Patient education given on motrin and the patient expresses understanding and acceptance of instructions. Ambrose Mantle, RN 06/11/2018 8:06 PM

## 2018-06-11 NOTE — ED Notes (Signed)
Patient complaining of abdominal pain that started X1 hour ago.  Patient also noticed blood in his urine today.  Patient with 3 episodes of vomiting.

## 2018-06-11 NOTE — ED Notes (Signed)
Patient voided in urinal.  Urine is amber in color

## 2018-06-11 NOTE — ED Notes (Signed)
Pt reports pain resolved .

## 2018-06-11 NOTE — ED Notes (Signed)
 Pt discharged home with parent/guardian. Pt acting age appropriately, respirations  Regular and unlabored, cap refill less than two seconds. Parent/guardian verbalized understanding of discharge paperwork and has no further questions at this time.

## 2018-06-11 NOTE — ED Notes (Signed)
Bedside and Verbal shift change report given to Corwin Levins, RN (oncoming nurse) by Vance Gather, RN (offgoing nurse). Report included the following information ED Summary.

## 2018-06-11 NOTE — ED Notes (Signed)
Patient attempted to void but states that he is unable to at this time.  Pt. Now ambulating to bathroom because he states that he is going to have a bowel movement

## 2019-10-22 IMAGING — DX DG CHEST 2V
2 series · 2 of 2 positions shown · non-contrast
Comparison: None.

CLINICAL DATA: Shortness of breath, chest pain for a month.

EXAM:
CHEST - 2 VIEW

[chest pa]
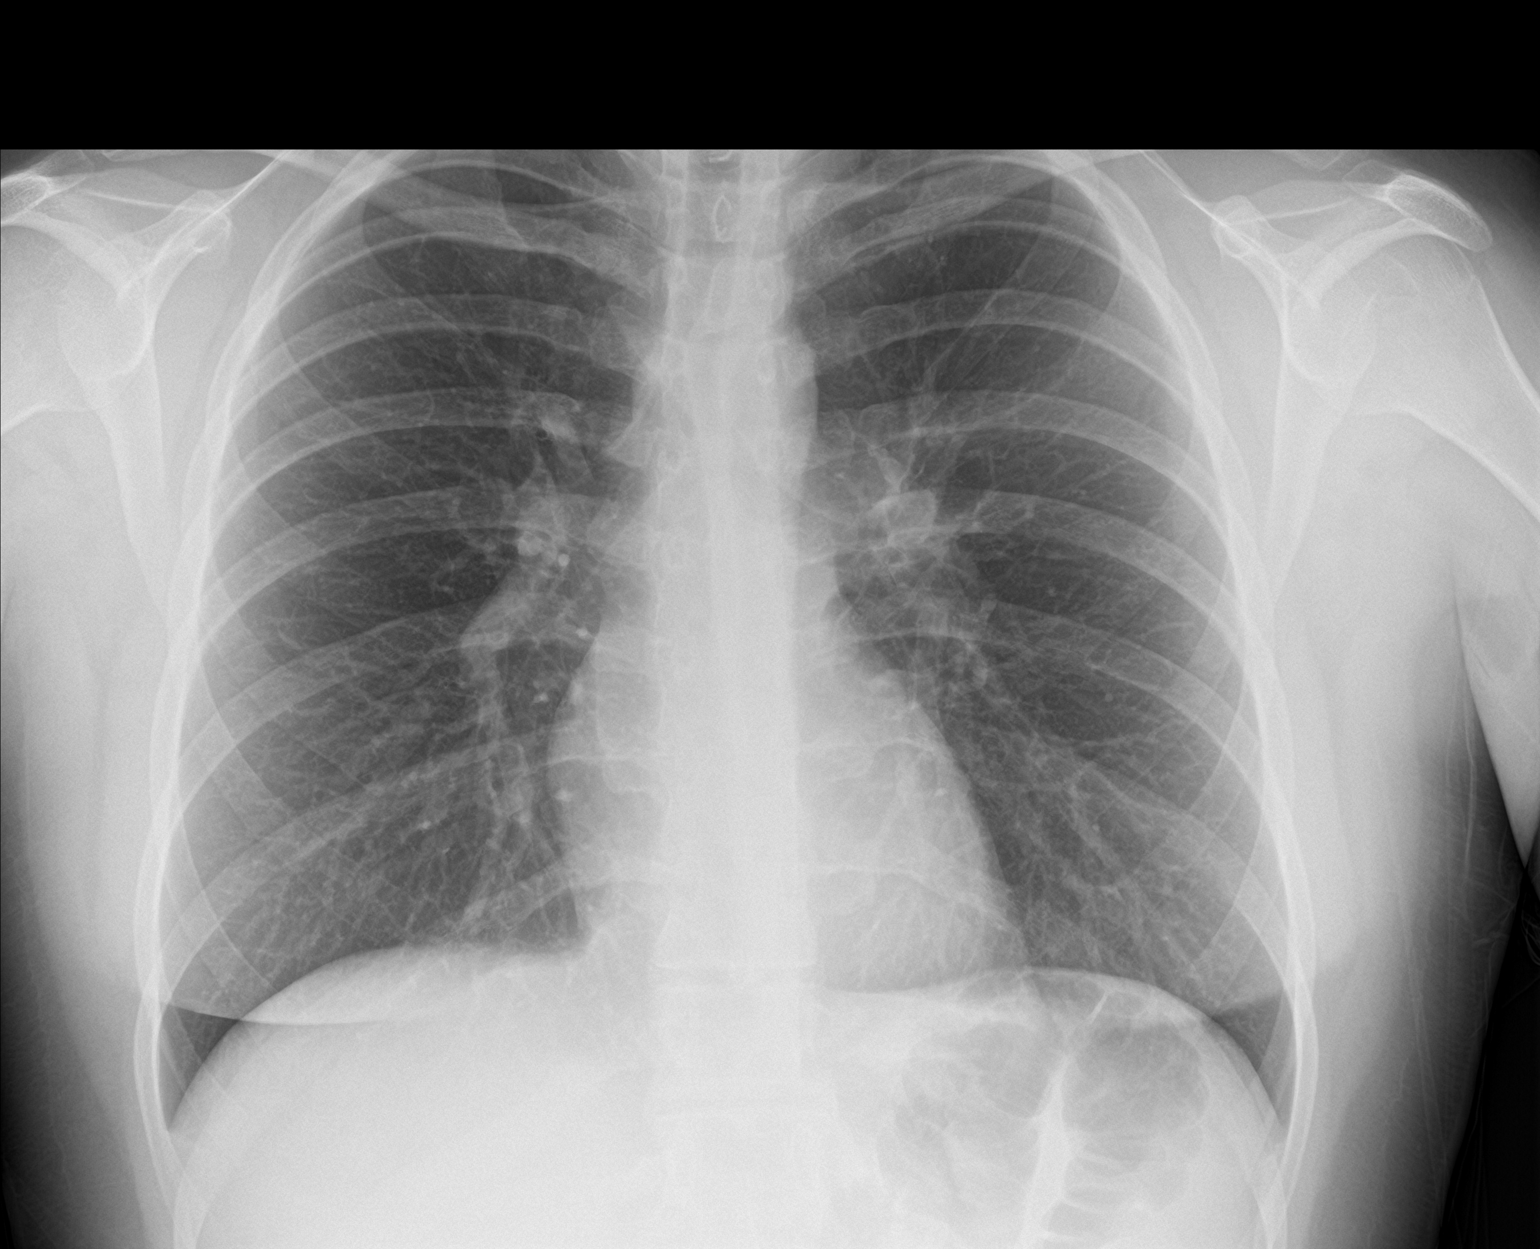

[chest lat]
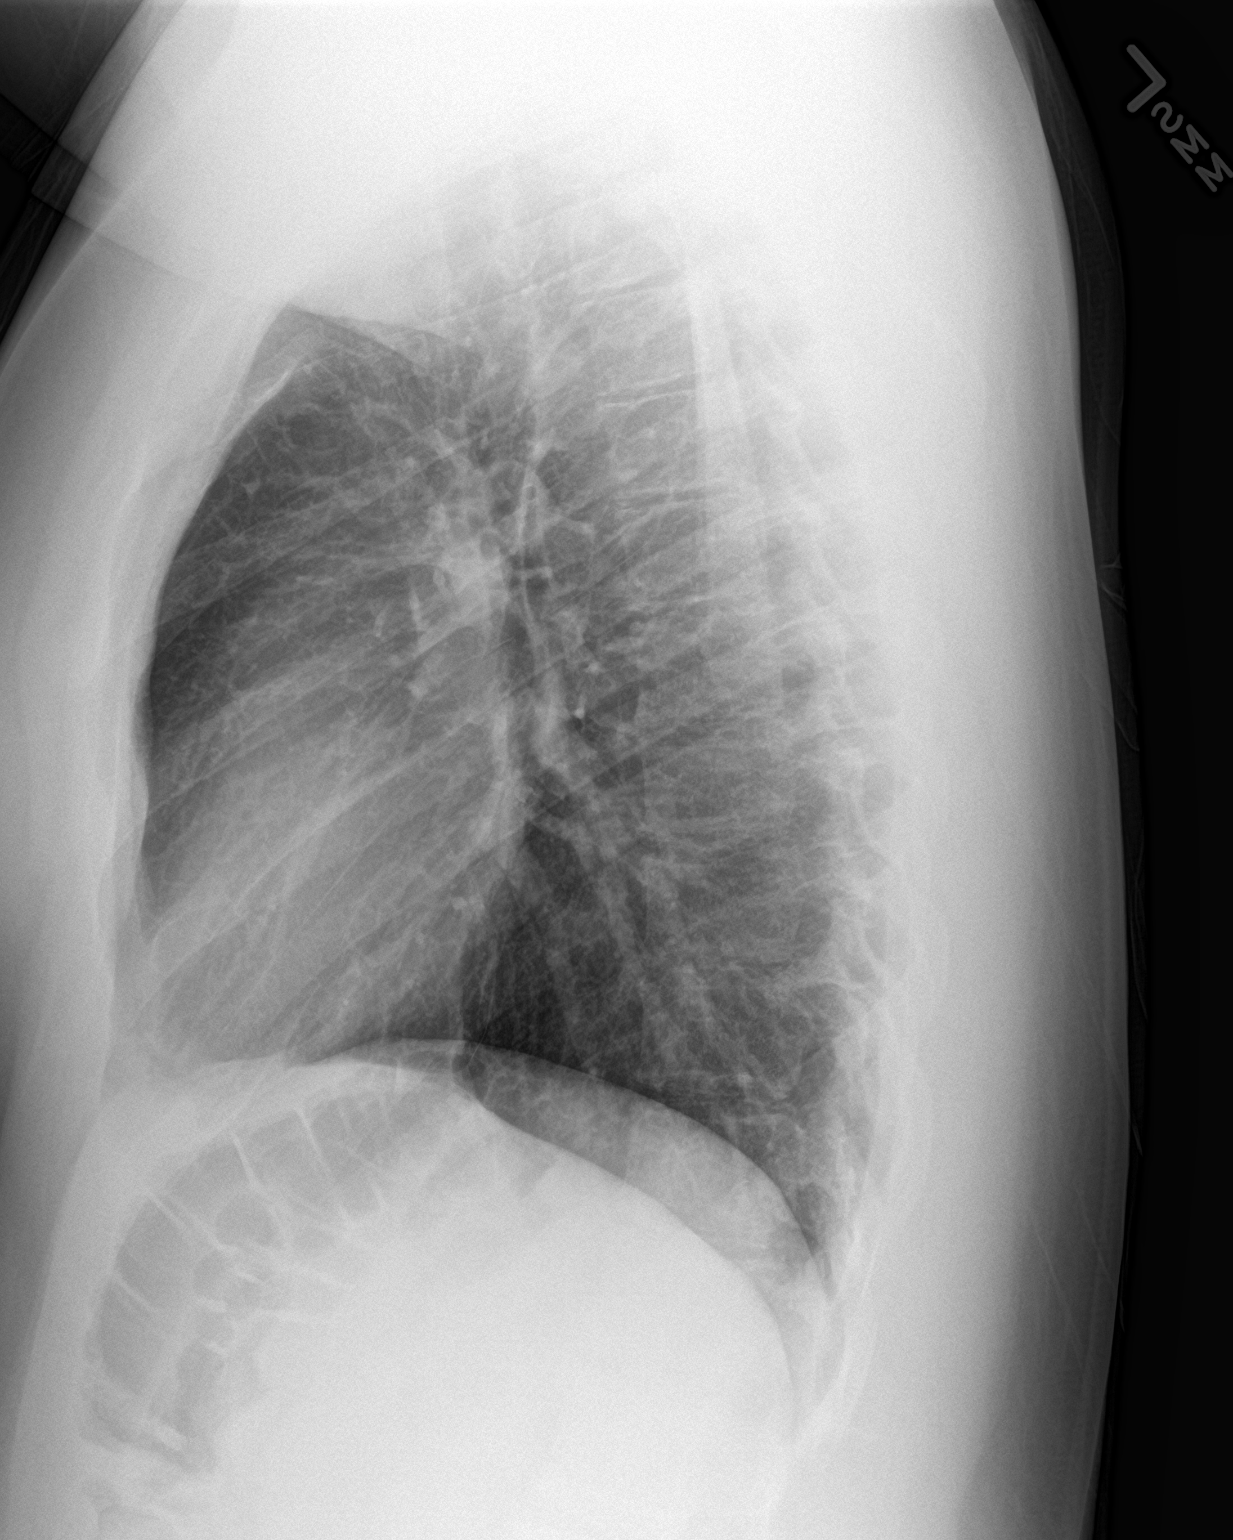

[2 of 2 positions shown; findings below may reference images not displayed]

FINDINGS: The heart size and mediastinal contours are within normal limits.
Both lungs are clear. The visualized skeletal structures are
unremarkable.
IMPRESSION: No active cardiopulmonary disease.
